# Patient Record
Sex: Female | Born: 1990 | Hispanic: No | Marital: Single
Health system: Southern US, Community
[De-identification: ages and names within clinical notes are randomized; demographics above are authoritative.]

---
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
## 2011-05-10 ENCOUNTER — Emergency Department: Payer: Self-pay | Admitting: *Deleted

## 2012-12-17 ENCOUNTER — Observation Stay: Payer: Self-pay | Admitting: Obstetrics and Gynecology

## 2012-12-17 LAB — CBC
HCT: 26.7 % — ABNORMAL LOW (ref 35.0–47.0)
HCT: 24.7 % — ABNORMAL LOW (ref 35.0–47.0)
HGB: 8.2 g/dL — ABNORMAL LOW (ref 12.0–16.0)
MCH: 26.7 pg (ref 26.0–34.0)
MCV: 81 fL (ref 80–100)
MCV: 81 fL (ref 80–100)
Platelet: 240 10*3/uL (ref 150–440)
Platelet: 209 10*3/uL (ref 150–440)
RBC: 3.4 10*6/uL — ABNORMAL LOW (ref 3.80–5.20)
RBC: 3.3 10*6/uL — ABNORMAL LOW (ref 3.80–5.20)
RBC: 3.03 10*6/uL — ABNORMAL LOW (ref 3.80–5.20)
RDW: 15.4 % — ABNORMAL HIGH (ref 11.5–14.5)
WBC: 11.9 10*3/uL — ABNORMAL HIGH (ref 3.6–11.0)
WBC: 9.8 10*3/uL (ref 3.6–11.0)

## 2012-12-17 LAB — LIPASE, BLOOD: Lipase: 112 U/L (ref 73–393)

## 2012-12-17 LAB — COMPREHENSIVE METABOLIC PANEL
Albumin: 2.5 g/dL — ABNORMAL LOW (ref 3.4–5.0)
Alkaline Phosphatase: 85 U/L (ref 50–136)
Anion Gap: 7 (ref 7–16)
BUN: 6 mg/dL — ABNORMAL LOW (ref 7–18)
Bilirubin,Total: 0.2 mg/dL (ref 0.2–1.0)
Creatinine: 0.66 mg/dL (ref 0.60–1.30)
EGFR (African American): >60
EGFR (Non-African Amer.): >60
Potassium: 3.5 mmol/L (ref 3.5–5.1)
SGOT(AST): 11 U/L — ABNORMAL LOW (ref 15–37)
SGPT (ALT): 8 U/L — ABNORMAL LOW (ref 12–78)

## 2013-01-01 LAB — URINALYSIS, COMPLETE
Bilirubin,UR: NEGATIVE
Glucose,UR: NEGATIVE mg/dL (ref 0–75)
Ketone: NEGATIVE
Nitrite: NEGATIVE
Squamous Epithelial: 2

## 2013-01-01 LAB — WET PREP, GENITAL

## 2013-01-02 ENCOUNTER — Inpatient Hospital Stay: Payer: Self-pay | Admitting: Obstetrics and Gynecology

## 2013-01-02 LAB — CBC WITH DIFFERENTIAL/PLATELET
Eosinophil #: 0.2 10*3/uL (ref 0.0–0.7)
Lymphocyte %: 19.4 %
MCHC: 34.3 g/dL (ref 32.0–36.0)
Monocyte %: 5.8 %
Neutrophil %: 72.8 %
Platelet: 327 10*3/uL (ref 150–440)
RBC: 3.37 10*6/uL — ABNORMAL LOW (ref 3.80–5.20)
RDW: 15.3 % — ABNORMAL HIGH (ref 11.5–14.5)
WBC: 15.2 10*3/uL — ABNORMAL HIGH (ref 3.6–11.0)

## 2013-01-02 LAB — DRUG SCREEN, URINE
Barbiturates, Ur Screen: NEGATIVE (ref ?–200)
Benzodiazepine, Ur Scrn: NEGATIVE (ref ?–200)
Cannabinoid 50 Ng, Ur ~~LOC~~: POSITIVE (ref ?–50)
Cocaine Metabolite,Ur ~~LOC~~: NEGATIVE (ref ?–300)
MDMA (Ecstasy)Ur Screen: NEGATIVE (ref ?–500)
Methadone, Ur Screen: NEGATIVE (ref ?–300)
Opiate, Ur Screen: NEGATIVE (ref ?–300)
Phencyclidine (PCP) Ur S: NEGATIVE (ref ?–25)
Tricyclic, Ur Screen: NEGATIVE (ref ?–1000)

## 2013-01-02 LAB — GLUCOSE, RANDOM: Glucose: 91 mg/dL (ref 65–99)

## 2013-01-02 LAB — RUPTURE OF MEMBRANE PLUS: Rom Plus: DETECTED

## 2013-01-02 LAB — GC/CHLAMYDIA PROBE AMP

## 2013-12-07 IMAGING — US US OB DETAIL+14 WK - NRPT [facility]
1 series · 14 of 28 positions shown · non-contrast
Comparison: none

[Series 1: us ob detail+14 wk - nrpt [facility] · 14 of 34 slices shown]
[im 2/34]
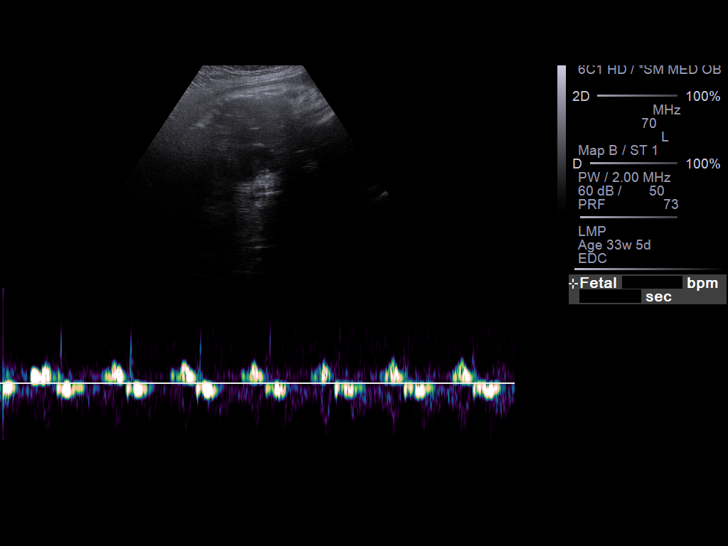
[im 4/34]
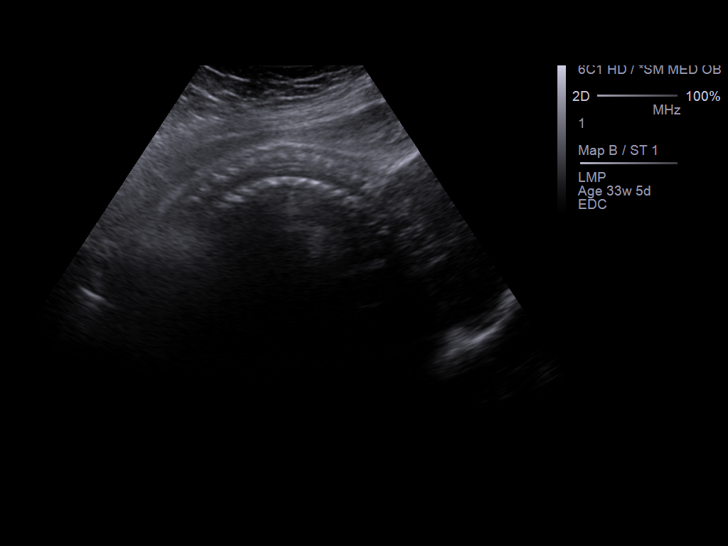
[im 7/34]
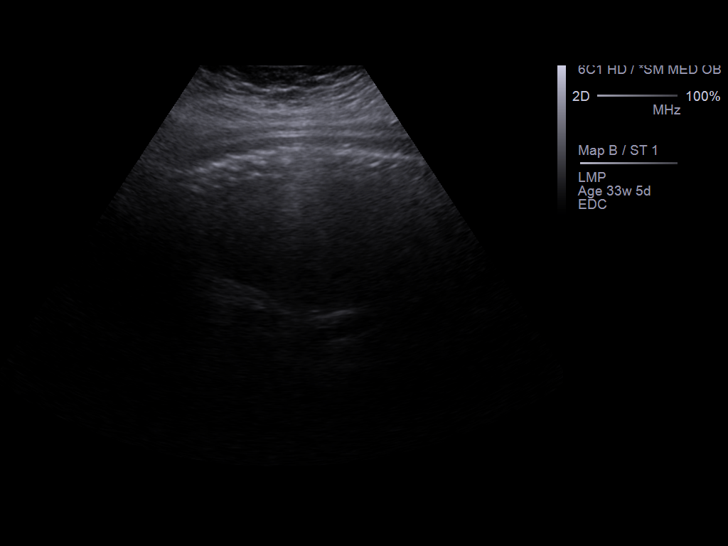
[im 9/34]
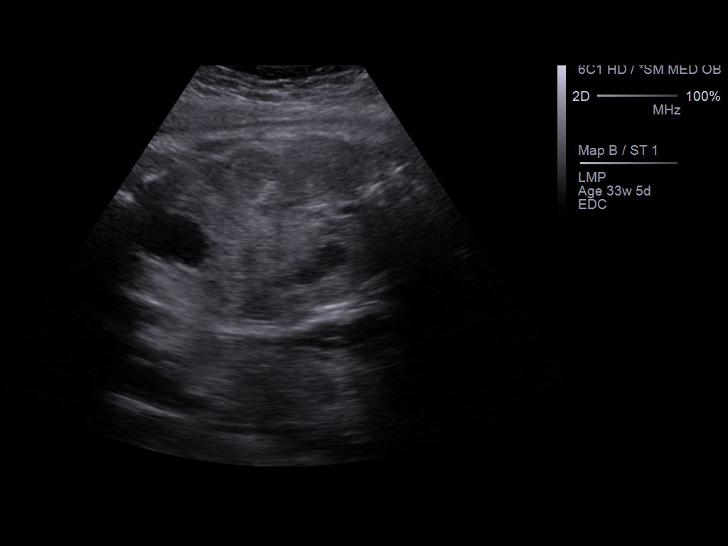
[im 12/34]
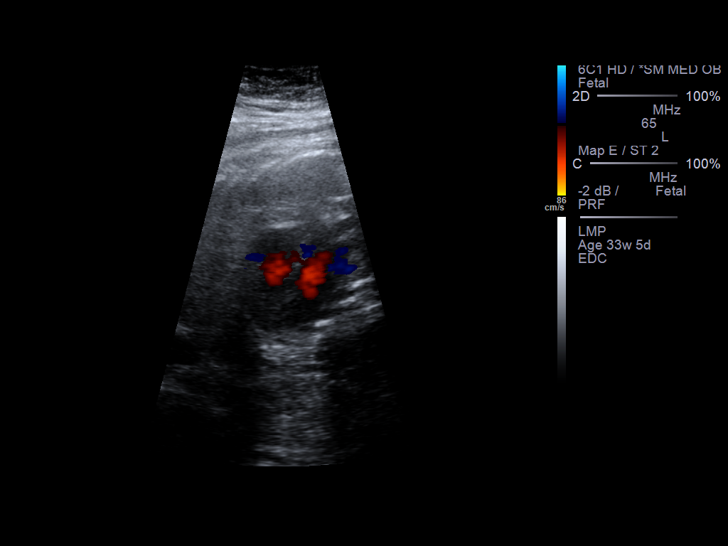
[im 14/34]
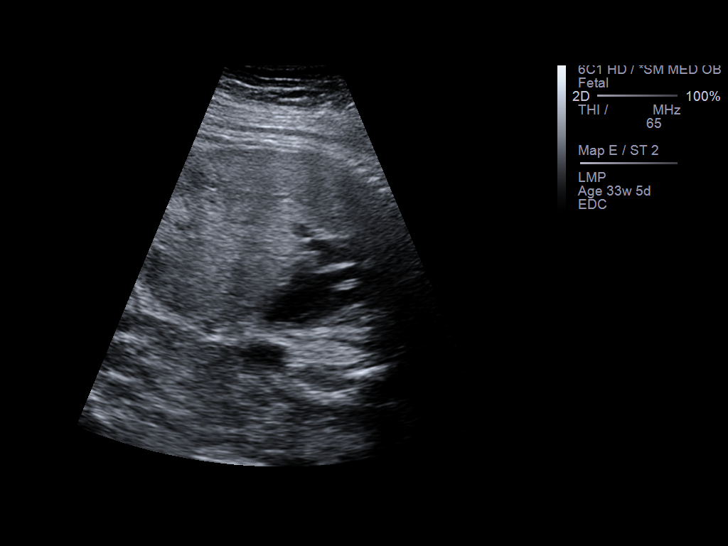
[im 16/34]
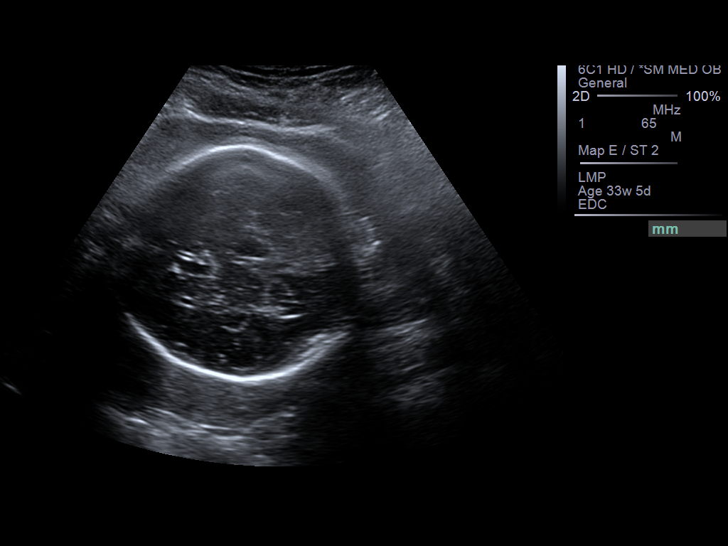
[im 19/34]
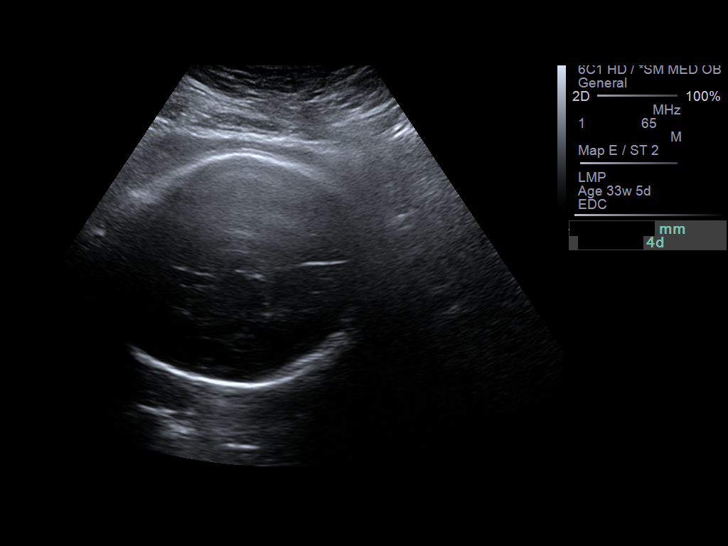
[im 21/34]
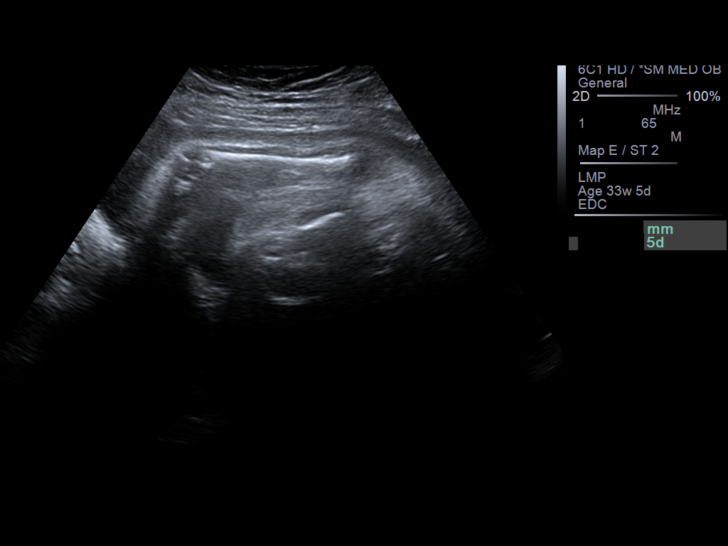
[im 24/34]
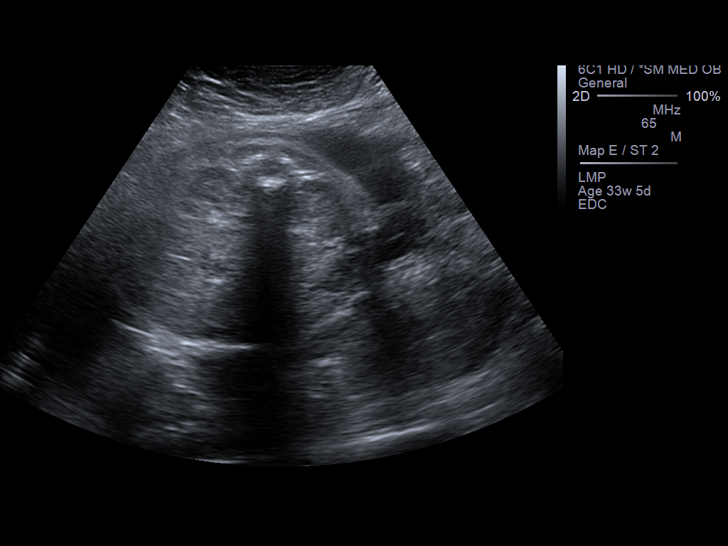
[im 26/34]
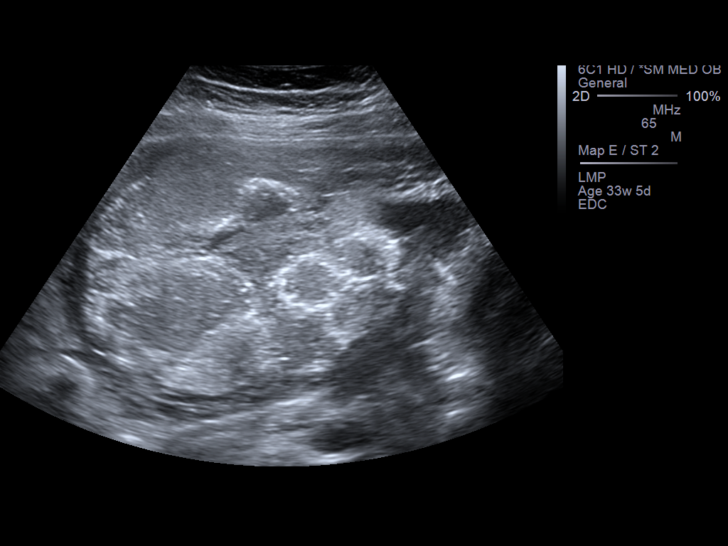
[im 29/34]
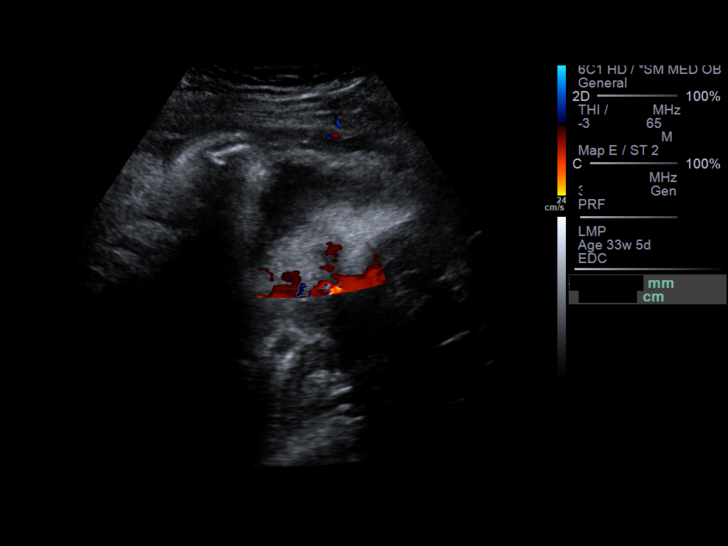
[im 31/34]
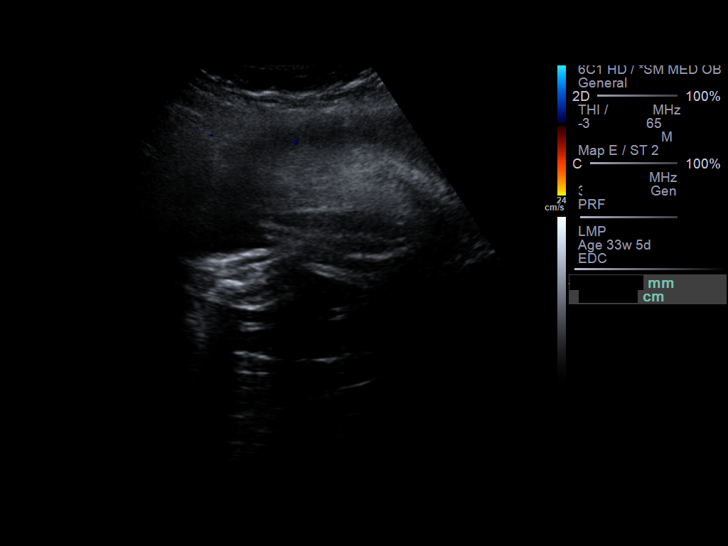
[im 34/34]
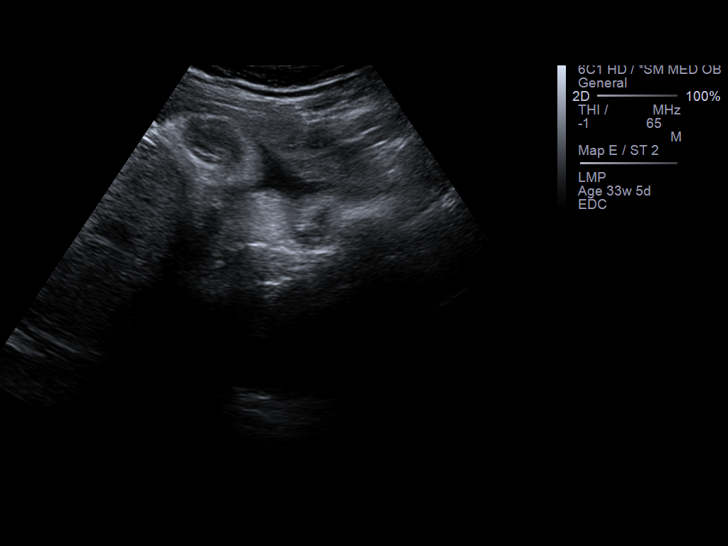

[14 of 28 positions shown; findings below may reference images not displayed]

IMAGES IMPORTED FROM THE [employer] WORKFLOW SYSTEM
NO DICTATION FOR STUDY

## 2014-09-21 NOTE — Consult Note (Signed)
   Maternal Age 23   Gravida 5   Para 1   Term Deliveries 1   Preterm Deliveries 0   Abortions 3   Living Children 1   Final EDD (dd-[id number]) 15-Feb-2013   GA Assessment: (Weeks) 33 week(s)   (Days) 3 day(s)   Gestation Single   Blood Type (Maternal) A positive   Antibody Screen Results (Maternal) negative   HIV Results (Maternal) negative   Gonorrhea Results (Maternal) unknown   Chlamydia Results (Maternal) unknown   Hepatitis C Culture (Maternal) unknown   Herpes Results (Maternal) n/a   VDRL/RPR/Syphilis Results (Maternal) negative   Rubella Results (Maternal) immune   Hepatitis B Surface Antigen Results (Maternal) negative   Group B Strep Results Maternal (Result >5wks must be treated as unknown) unknown/result > 5 weeks ago   Prenatal Care Unknown, [place]   Family/Social History Family recently relocated from [place]    Additional Comments [facility] to speak with 24 yo G5 P1 Ab 3 mother who was admitted yesterday with probable PPROM (see MFM note) and oligohydramnios.  No labor or signs of infection.  Mother has received one dose of BMZ and is scheduled to receive another tonight.  Also on antibiotics (ampicillin and erythromycin). Mother describes herself as a "light smoker," denies etoh, other drugs.  Pregnancy previously complicated by "bacterial infection" which was treated with antibiotics and with an admission for severe emesis (sometimes bloody) per patient Hx.  Spoke with patient and FOB about likely outcome for delivery at 33 - [redacted] wks EGA, including possible need for DR resuscitation, respiratory support (O2, CPAP, respirator), and probable need for IV fluids and temp support.  Presented good long term prognosis with probable LOS 2 - 4 wks.  They had multiple questions about OB management which [facility].  Mother plans to breast feed and pump after delivery, and I encouraged this.  They asked a  few questions about neonatal care and expressed appreciation for my input.  Thanks for consulting Neonatology.  Face-to-face time 20 minutes   Parental Contact Parents informed at length regarding prenatal care and plan   Electronic Signatures: Serita GritWimmer, Holiday Mcmenamin E (MD)  (Signed 04-Aug-14 21:01)  Authored: PREGNANCY and LABOR, ADDITIONAL COMMENTS   Last Updated: 04-Aug-14 21:01 by Serita GritWimmer, Bianna Haran E (MD)

## 2014-09-21 NOTE — Consult Note (Signed)
Brief Consult Note: Diagnosis: hematemesis.   Patient was seen by consultant.   Recommend further assessment or treatment.   Orders entered.   Discussed with Attending MD.   Comments: Patient seen and examined, full consult to follow.  Chart reviewed.  Patient presenting with episode of bloody emesis, immediately after and episode of non-bloody emesis at about MN this am.  Not recurrent since then.  minimal abdominal pain in medial luq.  no melena, though had a mucously stool "streaked red" yesterday, heme neg in er.  Now on ppi drip after discussion with er MD this am.  Continue ivppi  drip for 24 hours, then change to i9v bid for a day before changing to po bid.  She has been having alot of heartburn with this pregnancy with daily am n/v  (Taking otc zantac occasionally).  Should be npo except ice for now.  Will follow with you.  Electronic Signatures for Addendum Section:  Barnetta ChapelSkulskie, Martin (MD) (Signed Addendum 19-Jul-14 18:19)  please see consult [phone].   Electronic Signatures: Barnetta ChapelSkulskie, Martin (MD)  (Signed 19-Jul-14 11:38)  Authored: Brief Consult Note   Last Updated: 19-Jul-14 18:19 by Barnetta ChapelSkulskie, Martin (MD)

## 2014-09-21 NOTE — Consult Note (Signed)
Referral Information:  Reason for Referral PPROM at 33 weeks   Referring Physician [facility]   Prenatal Hx Raven [name] is a 24 year-old G5 P1031 at 21 5/7 weeks by LMP consistent with 20 week ultrasound (EDC 02/15/13) who was admitted for PPROM on 01/01/13. Pt presented to [facility] with small gush of fluid. Her examination on L&D demonstrated a positive ROM plus test though physical examination demonstrated no pooling and vaginal fluid that was fern and nitrazine negative. Raven [name] reports off and on leaking of clear fluid overnight. She denies vaginal bleeding and reports occasional cramping. She reports good fetal movement.  She initiated her care in [place], [place] but moved back to the area about 6 weeks ago. She has not yet inititated prenatal care her.   Past Obstetrical Hx G5 P1031 2010: SAB at 7-8 weeks. No ultrasound. No D&C. Bleeding and cramping. Passed tissue.  2012: SAB at 6 weeks. US showed heartbeat.  Bleeding and cramping. No D&C 2013: Cesarean delivery at 38 weeks. Presented with SROM, augmented with pitocin. Arrest dilation at 7 cm and non-reassuring fetal status. Was told that she could labor in the futuer 2013: SAB at 6-7 weeks. No cardiac activity. No D&C   Allergies:   Sulfa drugs: Hives  Perinatal Consult:  PGyn Hx Denies history of abnormal paps.   Past Medical History cont'd Borderline personalitiy disorder Denies history of hypertension, thyroid disorders, or diabetes   PSurg Hx Cesarean, Wisdom teeth, Tonsillectomy.  Denies any surgical complications   FHx Denies FH of genetic disordes or MR.  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   Review Of Systems:  Subjective No complaints. Good fetal movement. No contractions, vaginal bleeding or abdominal pain.  Reports leaking clear fluid. No urinary symptoms.   Fever/Chills No   Nausea/Vomiting No   SOB/DOE No   Chest Pain No   Tolerating Diet Yes    Medications/Allergies Reviewed No medications   Exam:  Abdomen soft, nontender, no masses, Fundus nontender   Vagina No pooilng. No lesions. Small amount of fluid from cervical os with coughing   Cervix Visibly closed, no lesions   Routine BB:  04-Aug-14 01:39   ABO Group + Rh Type A Positive  Antibody Screen NEGATIVE (Result(s) reported on 02 Jan 2013 at 03:29AM.)  Routine Micro:  03-Aug-14 22:22   Micro Text Report WET PREP   COMMENT                   RARE WHITE BLOOD CELLS SEEN   COMMENT                   NO TRICHOMONAS,SPERMATOZOA,YEAST,OR CLUE CELLS SEEN   ANTIBIOTIC                       Comment 1. RARE WHITE BLOOD CELLS SEEN  Comment 2. NO TRICHOMONAS,SPERMATOZOA,YEAST,OR CLUE CELLS SEEN  Result(s) reported on 01 Jan 2013 at 10:58PM.  Urine Drugs:  04-Aug-14 22:22   Cannabinoid, Urine Qual. POSITIVE  Routine UA:  03-Aug-14 22:22   Leukocyte Esterase (UA) Trace (Result(s) reported on 01 Jan 2013 at 11:54PM.)  WBC (UA) 8 /HPF  Bacteria (UA) 1+  Routine Hem:  04-Aug-14 01:39   WBC (CBC)  15.2  Hemoglobin (CBC)  9.3  Hematocrit (CBC)  27.1  Platelet Count (CBC) 327    Additional Lab/Radiology Notes [place] (01/02/13): [facility]. See report   Impression/Recommendations:  Impression 24  year-old G5 P1031 at 4033 5/7 weeks with oligohydramnios and complaints of leakage of fluid, though tests for PPROM are conflicting. ROM Plus test was positive and pelvic exam last night demonstrated no pooling and fern/nitrazine neg. Repeat pelvic examination today demonstrated no pooling.   Recommendations -Pt admitted for presumed PPROM. Receiving latency antibiotics and steroids.  -No evidence of chorioamnionitis -Given abrupt onset of leakage of evidence of olighydramios on ultrasound, findings likely represent PPROM.  I offered performing an amniodye test to verify the diagnosis. An alternative to this would be to keep her in bed overnight and then repeat her pelvic examination in  the morning before she is allowed to get up, repeating pooling/ferning at that time. -If repeat examination demonstrates evidence of rupture of membranes, recommend delivery at 34-35 weeks -Pt strongly desires to VBAC.  She was told by her delivery provider in [place] who performed her cesarean that she could labor in the future.  The [facility] [place] could provide her operative report summary to ensure that she had a low-transverse uterine incision. We discussed risks and benefits of VBAC compared to repeat cesarean. -Continue latency antibiotcs and complete course of antinatal corticosteroids -If repeat pelvic examination is again inconclusive, we could perform an amniodye test on Thursday.  If that is negative, then Raven [name] will need twice weekly testing in setting of oligohydramnios. -We discussed risks of prematurity vs. risks of infection -We discussed risk of THC and smoking in pregnancy.    Total Time Spent with Patient > 60 minutes   >50% of visit spent in couseling/coordination of care yes   Office Use Only [address]  INPT Consult Comprehensive Moderate (80 min)   Coding Description: MATERNAL CONDITIONS/HISTORY INDICATION(S).   OTHER: oligohydramnios.  Electronic Signatures: Jobeth Pangilinan, [place] (MD)  (Signed 04-Aug-14 12:44)  Authored: Referral, Allergies, Consult, Exam, Lab, Lab/Radiology Notes, Impression, Billing, Coding Description   Last Updated: 04-Aug-14 12:44 by Palmer Shorey, [place] (MD)

## 2014-09-21 NOTE — Consult Note (Signed)
PATIENT NAME:  Raven [name], Raven [name] MR#:  [id number] DATE OF BIRTH:  [birth year]  DATE OF CONSULTATION:  12/17/2012  CONSULTING PHYSICIAN:  Christena DeemMartin U. Britiny Defrain, MD  PHYSICIAN: Flint MelterLashawn A. Patton SallesWeaver-Lee, MD  REASON FOR CONSULTATION: Hematemesis/upper GI bleed.   HISTORY OF PRESENT ILLNESS: [name] is a 24 year old Caucasian female who is currently 31-1/2 weeks gravida. She came to the Emergency Room in the early morning hours today with complaint of throwing up some blood. History over the past several days reflects the following: Yesterday, she states that what when she woke in the morning, she threw up some food that she had eaten the night before. She stated this was a "stromboli." She ate small amounts over the course of the day until about 6:00 p.m. that evening. She had an episode of some diarrhea, which she stated had mucus and red streaks or traces of blood that were bright red. She denies any problems with constipation. Generally, she has a regular bowel movement daily. She stated that she had an episode of this bowel movement twice, once at 6:00 p.m. last night, again at 7:00 p.m. last night, and since that time has had no further stools or diarrhea particularly. She awoke at midnight, was very nauseated and threw up. The first time she threw up was food. The second time she threw up was dark red material. She has not had any emesis since. She did come to the Emergency Room she states at about 1:30 a.m. She has been hemodynamically stable. She has had no melena or black stools. She states she occasionally has been getting some left upper quadrant discomfort. She has a history of peptic ulcer disease diagnosed about 1-1/2 years ago. At that time, she was evaluated via EGD at a hospital in [place] and was told she had a "small ulcer." She has had a lot of right reflux symptoms this pregnancy and will occasionally take a Zantac every 2 to 3 days. This was not prescribed. She is getting this over-the-counter.    PAST MEDICAL HISTORY: She has had 5 pregnancies. She had 3 miscarriages, 1 live birth. She has had a cesarean section. She has had oral surgery. She has had a remote tonsillectomy and  adenoidectomy. She denies other ongoing medical problems.   GASTROINTESTINAL FAMILY HISTORY: Pertinent for cirrhosis of the liver in 2 uncles and a grandfather. Two of these were likely related to alcohol use.   SOCIAL HISTORY: The patient smokes 5 to 7 cigarettes a day. She denies any use of alcohol, this being a rare occurrence. Her EDC is 02/15/2013.   PHYSICAL EXAMINATION: VITAL SIGNS: Temperature 98.5, pulse 97, respirations 17, blood pressure 96/40.  GENERAL: She is a well-appearing 24 year old female in no acute distress.  HEENT: Normocephalic, atraumatic. Eyes: Anicteric. Nose: Septum midline. No lesions. Oropharynx: No lesions.  NECK: Supple. No JVD. No lymphadenopathy. No thyromegaly.  HEART: Regular rate and rhythm without rub or gallop.  LUNGS: Bilaterally clear.  ABDOMEN: Soft, gravid. Minimal tenderness in the left upper quadrant. Bowel sounds are positive, normoactive. There is no apparent organomegaly otherwise.  EXTREMITIES: No clubbing, cyanosis or edema.  NEUROLOGICAL: Cranial nerves II through XII grossly intact. Muscle strength bilaterally equal and symmetric, 5 out of 5. DTRs bilaterally equal and symmetric.   LABORATORY DATA: From this morning showed a hepatic profile with a total protein of 5.9, albumin 2.5, total bilirubin 0.2, alkaline phosphatase 85, AST 11, ALT 8. Metabolic panel showing glucose of 97, BUN 6, creatinine 0.66, sodium 139,  potassium 3.5, chloride 106, bicarbonate 26, calcium 8.4, lipase 112. Hemogram at 0204 this morning with a white count of 13.0, hemoglobin and hematocrit 9.1 and 27.6, platelet count 240; this was repeated over the course of the morning until 0641 with the finding of the second hemoglobin at 0419 being 9.0 and the third at 0641 being 8.2. She has  actually shown no evidence of either hematemesis or hematochezia. She was typed and screened. There was no imaging.   ASSESSMENT: Hematemesis. The patient's history reflects likely Mallory-Weiss tear after an episode of emesis. Possibility that she had had a small amount of "food poisoning"/gastroenteritis.  However, she also has a history of peptic ulcer disease in the past. She also has had a lot of problems with reflux-related issues during this pregnancy and has been taking minimal medication in this regard. She is hemodynamically stable and has had no recurrence of the hematemesis.   RECOMMENDATIONS: 1.  As discussed with ER physician this morning and through his discussion with Dr. Tiburcio Pea, agree with starting Protonix IV drip. I would continue this for 24 hours, then change to 40 mg IV twice a day before changing to an oral 40 mg Protonix pill twice a day. I would continue that for about 2 weeks and then change back down to a 40 mg once-a-day tablet versus using Prevacid as a daily agent in the 30 mg dose.  2.  I have advised the patient and her nurse that today she should only be having some ice chips. Tomorrow would start her on noncarbonated, no red, clear liquids, to advance to full liquids the following day if tolerating. Would recommend serial hemoglobins. I have discussed with the patient and with Dr. Patton Salles that it would be best if we did not need to proceed with a scope, thus, the need for very conservative management. Will follow with you.   ____________________________ Christena Deem, MD mus:jm D: 12/17/2012 18:18:34 ET T: 12/17/2012 19:46:42 ET JOB#: [id number]  cc: Christena Deem, MD, <Dictator> Lashawn A. Patton Salles, MD Christena Deem MD ELECTRONICALLY SIGNED 12/26/2012 7:15
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
## 2014-10-09 NOTE — H&P (Signed)
L&D Evaluation:  History:  HPI 24 yo G5P1031 @ 31.4wks EDC 02/15/13 by 12wk scan presents to ER with vomiting blood.  [place].  She reports she ate a stromboli 2 days ago.  The next morning she began to have bloody vomiting and watery stools.  She has never had this before.  She has had diarrhea since arriving but has not vomited.  She has tolerated water.  In the ER, she was started on a Protonix drip per GI.  She has a history of PPROM @ 23wks with her first pregnancy.  She reports that shortly after that, she was told that she was no longer leaking fluid and that the leakage had healed.  She subsequently delivered at 38wks by C-section for what sounds like fetal distress at 7cm.  PMH significant for gastric ulcer early in her last pregnancy.  She reports prediabetes but had a normal early glucola.  She has not had the 28wk glucola.   Presents with nausea/vomiting   Patient's Medical History Gastric ulcer   Patient's Surgical History Previous C-Section  Tonsillectomy   Medications Zantac   Allergies Sulfa   Social History none    Family History Non-Contributory    Exam:  Vital Signs stable    General no apparent distress   Mental Status clear    Chest clear    Heart normal sinus rhythm   Abdomen gravid, non-tender   Edema no edema    Pelvic deferred   Mebranes Intact   FHT normal rate with no decels   Ucx absent   Skin dry   Impression:  Impression 31.4wk IUP, Hematemesis thought to be due to possible Mallory-Weiss tear   Plan:  Comments 1)  Appreciate GI consult.  Pt has been started on Protonix drip by GI.  This will continue for 24hrs.  It will be given bid after that and, if stable, pt can be discharged home on oral protonix. 2)  EFM.  Reassuring FHR at present. 3)  NPO except ice and meds.  Antiemetics prn.   Electronic Signatures: Senaida LangeWeaver-Lee, Boden Stucky (MD)  (Signed 19-Jul-14 13:16)  Authored: L&D  Evaluation   Last Updated: 19-Jul-14 13:16 by Senaida LangeWeaver-Lee, Maylani Embree (MD)
